# Patient Record
Sex: Female | Born: 1965 | Race: White | Hispanic: No | Marital: Married | Smoking: Never smoker
Health system: Southern US, Community
[De-identification: ages and names within clinical notes are randomized; demographics above are authoritative.]

## PROBLEM LIST (undated history)

## (undated) DIAGNOSIS — C439 Malignant melanoma of skin, unspecified: Secondary | ICD-10-CM

## (undated) DIAGNOSIS — K449 Diaphragmatic hernia without obstruction or gangrene: Secondary | ICD-10-CM

## (undated) DIAGNOSIS — C801 Malignant (primary) neoplasm, unspecified: Secondary | ICD-10-CM

## (undated) DIAGNOSIS — K219 Gastro-esophageal reflux disease without esophagitis: Secondary | ICD-10-CM

## (undated) HISTORY — PX: LYMPH GLAND EXCISION: SHX13

## (undated) HISTORY — DX: Gastro-esophageal reflux disease without esophagitis: K21.9

## (undated) HISTORY — DX: Malignant melanoma of skin, unspecified: C43.9

## (undated) HISTORY — PX: UPPER GI ENDOSCOPY: SHX6162

## (undated) HISTORY — DX: Diaphragmatic hernia without obstruction or gangrene: K44.9

---
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
## 1986-12-12 HISTORY — PX: ANKLE SURGERY: SHX546

## 2002-12-12 DIAGNOSIS — C439 Malignant melanoma of skin, unspecified: Secondary | ICD-10-CM

## 2002-12-12 HISTORY — DX: Malignant melanoma of skin, unspecified: C43.9

## 2002-12-12 HISTORY — PX: MELANOMA EXCISION: SHX5266

## 2007-06-26 ENCOUNTER — Ambulatory Visit: Payer: Self-pay | Admitting: Unknown Physician Specialty

## 2007-11-06 ENCOUNTER — Ambulatory Visit: Payer: Self-pay | Admitting: Unknown Physician Specialty

## 2007-12-11 ENCOUNTER — Ambulatory Visit: Payer: Self-pay | Admitting: Unknown Physician Specialty

## 2008-03-12 ENCOUNTER — Ambulatory Visit: Payer: Self-pay | Admitting: Unknown Physician Specialty

## 2008-07-16 ENCOUNTER — Ambulatory Visit: Payer: Self-pay | Admitting: Unknown Physician Specialty

## 2009-12-02 ENCOUNTER — Ambulatory Visit: Payer: Self-pay | Admitting: Unknown Physician Specialty

## 2009-12-15 ENCOUNTER — Ambulatory Visit: Payer: Self-pay | Admitting: Unknown Physician Specialty

## 2010-12-03 ENCOUNTER — Ambulatory Visit: Payer: Self-pay | Admitting: Unknown Physician Specialty

## 2011-12-14 ENCOUNTER — Ambulatory Visit: Payer: Self-pay

## 2012-12-12 HISTORY — PX: CHOLECYSTECTOMY: SHX55

## 2013-06-10 ENCOUNTER — Ambulatory Visit: Payer: Self-pay

## 2013-07-26 ENCOUNTER — Ambulatory Visit: Payer: Self-pay | Admitting: Family Medicine

## 2013-07-30 ENCOUNTER — Other Ambulatory Visit: Payer: Self-pay | Admitting: General Surgery

## 2013-07-30 ENCOUNTER — Encounter: Payer: Self-pay | Admitting: General Surgery

## 2013-07-30 ENCOUNTER — Ambulatory Visit (INDEPENDENT_AMBULATORY_CARE_PROVIDER_SITE_OTHER): Payer: BC Managed Care – PPO | Admitting: General Surgery

## 2013-07-30 VITALS — BP 152/82 | HR 88 | Resp 16 | Ht 71.0 in | Wt 164.0 lb

## 2013-07-30 DIAGNOSIS — K8020 Calculus of gallbladder without cholecystitis without obstruction: Secondary | ICD-10-CM

## 2013-07-30 DIAGNOSIS — K8000 Calculus of gallbladder with acute cholecystitis without obstruction: Secondary | ICD-10-CM | POA: Insufficient documentation

## 2013-07-30 NOTE — Patient Instructions (Addendum)
.Cholecystostomy  The gallbladder is a pear-shaped organ that lies beneath the liver on the right side of the body. The gallbladder stores bile, a fluid that helps the body digest fats. However, sometimes bile and other fluids build up in the gallbladder because of an obstruction (for example, a gallstones). This can cause fever, pain, swelling, nausea and other serious symptoms. The procedure used to drain these fluids is called a cholecystostomy. A tube is inserted into the gallbladder. Fluid drains through the tube into a plastic bag outside the body. This procedure is usually done on people who are admitted to the hospital. The procedure is often recommended for people who cannot have gallbladder surgery right away, usually because they are too ill to make it through surgery. The cholecystostomy tube is usually temporary, until surgery can be done. RISKS AND COMPLICATIONS Although rare, complications can include: Clogging of the tube. Infection in or around the drain site. Antibiotics might be prescribed for the infection. Or, another tube might be inserted to drain the infected fluid. Internal bleeding from the liver. BEFORE THE PROCEDURE  Try to quit smoking several weeks before the procedure. Smoking can slow healing. Arrange for someone to drive you home from the hospital. Right before your procedure, avoid all foods and liquids after midnight. This includes coffee, tea and water. On the day of the procedure, arrive early to fill out all the paperwork. PROCEDURE You will be given a sedative to make you sleepy and a local anesthetic to numb the skin. Next, a small cut is made in the abdomen. Then a tube is threaded through the cut into the gallbladder. The procedure is usually done with ultrasound to guide the tube into the gallbladder. Once the tube is in place, the drain is secured to the skin with a stitch. The tube is then connected to a drainage bag.  AFTER THE PROCEDURE  People who have  a cholecystostomy usually stay in the hospital for several days because they are so ill. You might not be able to eat for the first few days. Instead, you will be connected to an IV for fluids and nutrients. The procedure does not cure the blockage that caused the fluid to build up in the first place. Because of this, the gallbladder will need to be removed in the future. The drain is removed at that time. HOME CARE INSTRUCTIONS  Be sure to follow your healthcare provider's instructions carefully. You may shower but avoid tub baths and swimming until your caregiver says it is OK. Eat and drink according to the directions you have been given. And be sure to make all follow-up appointments.  Call your healthcare provider if you notice new pain, redness or swelling around the wound. SEEK IMMEDIATE MEDICAL CARE IF:  There is increased abdominal pain. Nausea or vomiting occurs. You develop a fever. The drainage tube comes out of the abdomen. Document Released: 02/24/2009 Document Revised: 02/20/2012 Document Reviewed: 02/24/2009 [facility] Patient Information 2014 [place], [place].    Sweating.  Chills.  Fever.  Yellowing of the skin and the whites of the eyes (jaundice). DIAGNOSIS  Your caregiver may order blood tests to look for infection or gallbladder problems. Your caregiver may also order imaging tests, such as an ultrasound or computed tomography (CT) scan. Further tests may include a hepatobiliary iminodiacetic acid (HIDA) scan. This scan allows your caregiver to see your bile move from the liver to the gallbladder and to the small intestine. TREATMENT  A hospital stay is usually  necessary to lessen the inflammation of your gallbladder. You may be required to not eat or drink (fast) for a certain amount of time. You may be given medicine to treat pain or an antibiotic medicine to treat an infection. Surgery may be needed to remove your gallbladder (cholecystectomy) once the inflammation has  gone down. Surgery may be needed right away if you develop complications such as death of gallbladder tissue (gangrene) or a tear (perforation) of the gallbladder.  HOME CARE INSTRUCTIONS  Home care will depend on your treatment. In general:  If you were given antibiotics, take them as directed. Finish them even if you start to feel better.  Only take over-the-counter or prescription medicines for pain, discomfort, or fever as directed by your caregiver.  Follow a low-fat diet until you see your caregiver again.  Keep all follow-up visits as directed by your caregiver. SEEK IMMEDIATE MEDICAL CARE IF:   Your pain is increasing and not controlled by medicines.  Your pain moves to another part of your abdomen or to your back.  You have a fever.  You have nausea and vomiting. MAKE SURE YOU:  Understand these instructions.  Will watch your condition.  Will get help right away if you are not doing well or get worse. Document Released: 11/28/2005 Document Revised: 02/20/2012 Document Reviewed: 10/14/2011 [facility] Patient Information 2014 [place], [place].  Patient's surgery has been scheduled for 07-31-13 at [facility].

## 2013-07-30 NOTE — Progress Notes (Signed)
Patient ID: Sabrina Ritter, female   DOB: 05/28/1966, 47 y.o.   MRN: [id number]  Chief Complaint  Patient presents with  . Other    gallstones    HPI Sabrina Ritter is a 47 y.o. female here today for abdominal pain been going on for six months. Patient states the pains starts in her right upper quadrant and continues to her back. The pain typically begins late morning and will persist in the early afternoon. She has the same breakfast every day, one cup of old female, and has not noticed any distinct dietary pattern. The pain began in mid abdomen, loose the right upper quadrant and then in the back, into the intrascapular region. There's been some nausea but no vomiting. No change in bowel habits (every 2-3 days). Abdominal bloating has been significant at times. No weight loss. The pain lasted up to three to six hours daily.She had an ultrasound on 07/26/13 and had blood work done Dr.Hedricks office on 07/22/13. The patient reports that she was diagnosed with a hiatal hernia in 2008. Upper endoscopy completed by Lynnae Prude showed a small hernia. Biopsies were negative for pathologic change in both the distal esophagus and the stomach. She normally has good relief of her reflux symptoms with the use of Protonix.  She reports her mother and 2 maternal aunts have had gallbladder surgery.  The patient reports that when she is undergone general anesthesia in the past she takes quite a while to wake up. (Last procedure was a left upper extremity melanoma excision with sentinel node biopsy completed at [facility]. She was not given any explanation for the delayed awakening. HPI  Past Medical History  Diagnosis Date  . GERD (gastroesophageal reflux disease)   . Hiatal hernia     Past Surgical History  Procedure Laterality Date  . Melanoma excision Left 2004    stage 3   . Ganglion cyst excision Left 1974  . Ankle surgery Right 1988  . Lymph gland excision Left     2 lymph glands removed.  Marland Kitchen Upper  gi endoscopy      Family History  Problem Relation Age of Onset  . Cholelithiasis Mother     Social History History  Substance Use Topics  . Smoking status: Never Smoker   . Smokeless tobacco: Never Used  . Alcohol Use: No    Allergies  Allergen Reactions  . Keflex [Cephalexin]     Bone problems    Current Outpatient Prescriptions  Medication Sig Dispense Refill  . b complex vitamins tablet Take 1 tablet by mouth daily.      . Calcium-Vitamin D-Vitamin K (CALCIUM + D + K PO) Take 1 tablet by mouth daily.      . cetirizine (ZYRTEC) 10 MG tablet Take 10 mg by mouth daily.      Marland Kitchen estradiol (CLIMARA - DOSED IN MG/24 HR) 0.1 mg/24hr patch Place 1 patch onto the skin.      . Multiple Vitamins-Minerals (MULTIVITAMIN WITH MINERALS) tablet Take 1 tablet by mouth daily.      . pantoprazole (PROTONIX) 40 MG tablet Take 40 mg by mouth daily.       No current facility-administered medications for this visit.    Review of Systems Review of Systems  Constitutional: Negative.   Respiratory: Negative.   Cardiovascular: Negative.   Gastrointestinal: Positive for nausea and abdominal pain. Negative for vomiting, diarrhea, constipation, blood in stool, abdominal distention, anal bleeding and rectal pain.    Blood pressure 152/82, pulse  88, resp. rate 16, height 5\' 11"  (1.803 m), weight 164 lb (74.39 kg), last menstrual period 07/16/2013.  Physical Exam Physical Exam  Constitutional: She is oriented to person, place, and time. She appears well-developed and well-nourished.  Neck: Neck supple.  Cardiovascular: Normal rate, regular rhythm and normal heart sounds.   Pulmonary/Chest: Breath sounds normal.  Abdominal: Soft. Bowel sounds are normal. There is no hepatosplenomegaly.  Lymphadenopathy:    She has no cervical adenopathy.    She has no axillary adenopathy.  Neurological: She is alert and oriented to person, place, and time.  Skin: Skin is warm and dry.    Data Reviewed EGD  and biopsy results of 2008.  Abdominal ultrasound dated 07/26/2013 showed multiple sizable stones without evidence of acute cholecystitis. No other intra-abdominal pathology detected. Common bile duct diameter: 4.1 mm.  Laboratory studies dated 07/23/2013 showed normal CBC, normal comprehensive metabolic panel, bilirubin 0.4, creatinine 0.9 with an estimated GFR greater than 60.  Assessment    Cholelithiasis, possible chronic cholecystitis.  Left upper extremity melanoma.  Delayed awakening from anesthesia.      Plan    The likelihood of a cold metastatic disease from her melanoma of 10 years ago seems unlikely. If there were causing her abdominal pain, I would've expected abnormal liver function studies. Considering the size of the stones, I think is reasonable to proceed to cholecystectomy. If she remains asymptomatic and no intra-abdominal findings are appreciated during the laparoscopic portion of the procedure, further diagnostic testing could be undertaken.  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 With school scheduled to start on August 25 she was anxious to proceed early to cholecystectomy.    Patient's surgery has been scheduled for 07-31-13 at [facility].    [name] 07/30/2013, 10:22 AM

## 2013-07-31 ENCOUNTER — Ambulatory Visit: Payer: Self-pay | Admitting: General Surgery

## 2013-07-31 DIAGNOSIS — K8010 Calculus of gallbladder with chronic cholecystitis without obstruction: Secondary | ICD-10-CM

## 2013-08-02 ENCOUNTER — Encounter: Payer: Self-pay | Admitting: General Surgery

## 2013-08-02 LAB — PATHOLOGY REPORT

## 2013-08-07 ENCOUNTER — Encounter: Payer: Self-pay | Admitting: General Surgery

## 2013-08-07 ENCOUNTER — Ambulatory Visit (INDEPENDENT_AMBULATORY_CARE_PROVIDER_SITE_OTHER): Payer: BC Managed Care – PPO | Admitting: General Surgery

## 2013-08-07 VITALS — BP 144/80 | HR 68 | Resp 14 | Ht 71.0 in | Wt 164.0 lb

## 2013-08-07 DIAGNOSIS — K8020 Calculus of gallbladder without cholecystitis without obstruction: Secondary | ICD-10-CM

## 2013-08-07 NOTE — Patient Instructions (Addendum)
Patient may take tylenol, advil, or aleve as needed for pain or discomfort. The patient is aware to use a heating pad as needed for comfort. Patient may resume activities as tolerated, taking care to use proper technique when lifting .

## 2013-08-07 NOTE — Progress Notes (Signed)
Patient ID: Sabrina Ritter, female   DOB: 1966/04/10, 47 y.o.   MRN: [id number]  Chief Complaint  Patient presents with  . Routine Post Op    gallbladder    HPI Sabrina Ritter is a 47 y.o. female here today for her post op gallbladder surgery done on 07/31/13. Patient states she is doing well. HPI  Past Medical History  Diagnosis Date  . GERD (gastroesophageal reflux disease)   . Hiatal hernia     Past Surgical History  Procedure Laterality Date  . Melanoma excision Left 2004    stage 3   . Ganglion cyst excision Left 1974  . Ankle surgery Right 1988  . Lymph gland excision Left     2 lymph glands removed.  Marland Kitchen Upper gi endoscopy    . Cholecystectomy  2014    Family History  Problem Relation Age of Onset  . Cholelithiasis Mother     Social History History  Substance Use Topics  . Smoking status: Never Smoker   . Smokeless tobacco: Never Used  . Alcohol Use: No    Allergies  Allergen Reactions  . Keflex [Cephalexin]     Bone problems    Current Outpatient Prescriptions  Medication Sig Dispense Refill  . GILDESS FE 1/20 1-20 MG-MCG tablet Take 1 tablet by mouth daily.      Marland Kitchen b complex vitamins tablet Take 1 tablet by mouth daily.      . Calcium-Vitamin D-Vitamin K (CALCIUM + D + K PO) Take 1 tablet by mouth daily.      . cetirizine (ZYRTEC) 10 MG tablet Take 10 mg by mouth daily.      Marland Kitchen estradiol (CLIMARA - DOSED IN MG/24 HR) 0.1 mg/24hr patch Place 1 patch onto the skin.      . Multiple Vitamins-Minerals (MULTIVITAMIN WITH MINERALS) tablet Take 1 tablet by mouth daily.      . pantoprazole (PROTONIX) 40 MG tablet Take 40 mg by mouth daily.       No current facility-administered medications for this visit.    Review of Systems Review of Systems  Constitutional: Negative.   Respiratory: Negative.   Cardiovascular: Negative.     Blood pressure 144/80, pulse 68, resp. rate 14, height 5\' 11"  (1.803 m), weight 164 lb (74.39 kg), last menstrual period  07/16/2013.  Physical Exam Physical Exam  Constitutional: She is oriented to person, place, and time. She appears well-developed and well-nourished.  Cardiovascular: Normal rate, regular rhythm and normal heart sounds.   Pulmonary/Chest: Breath sounds normal.  Abdominal: Soft. Bowel sounds are normal. There is no tenderness.  Port site healing well.  Lymphadenopathy:    She has no cervical adenopathy.  Neurological: She is oriented to person, place, and time.  Skin: Skin is warm and dry.    Data Reviewed Pathology showed chronic cholecystitis and cholelithiasis.  Assessment    Doing well status post cholecystectomy.     Plan    Proper lifting technique reviewed. She may increase her activity as tolerated. She may begin her exercise program making use of one upper or lower extremity at a time for the next few weeks and then advanced to regular cardio equipment utilization.        [name] 08/08/2013, 7:16 PM

## 2013-08-08 ENCOUNTER — Encounter: Payer: Self-pay | Admitting: General Surgery

## 2013-12-17 DIAGNOSIS — D229 Melanocytic nevi, unspecified: Secondary | ICD-10-CM

## 2013-12-17 HISTORY — DX: Melanocytic nevi, unspecified: D22.9

## 2014-07-31 IMAGING — XA DG CHOLANGIOGRAM OPERATIVE
1 series · 1 of 1 positions shown · non-contrast
Comparison: none

REASON FOR EXAM: Cholelithiasis
COMMENTS:

PROCEDURE:     DXR - DXR CHOLANGIOGRAM OP (INITIAL)  - July 31, 2013  [DATE]
RESULT:

[Series 6001: by · 1 of 1 slices shown]
[im 1/1]
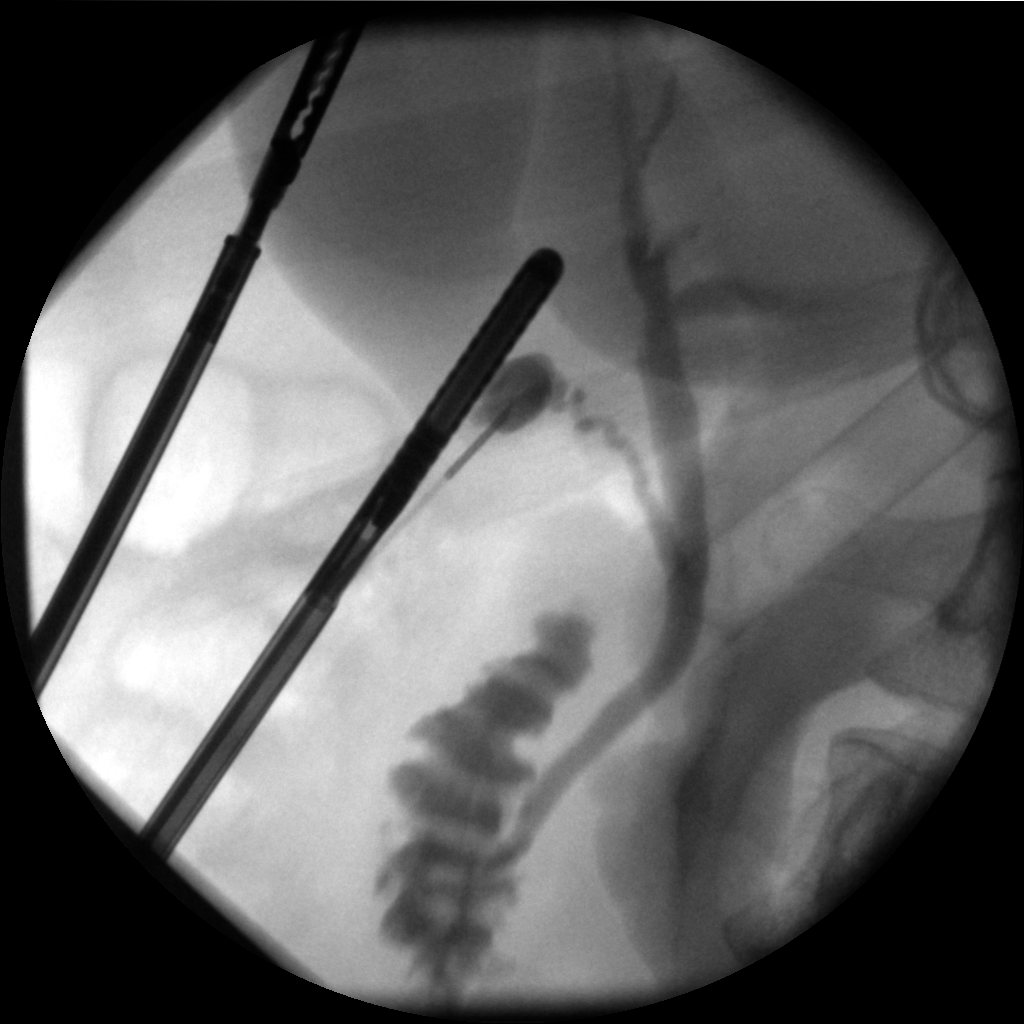

[1 of 1 positions shown; findings below may reference images not displayed]

FINDINGS: Contrast is appreciated within the cystic duct remnant, common
bile duct, intrahepatic biliary ductal radicals and duodenum. There is no
evidence of contrast extravasation, filling defects, strictural or segmental
narrowing.
IMPRESSION: 1. Intraoperative cholangiogram as described above. The remainder of the
dictation will be left to the performing physician.

## 2014-10-13 ENCOUNTER — Encounter: Payer: Self-pay | Admitting: General Surgery

## 2015-04-03 NOTE — Op Note (Signed)
PATIENT NAME:  Sabrina Ritter, Sabrina Ritter MR#:  [id number] DATE OF BIRTH:  [birth year]  DATE OF PROCEDURE:  07/31/2013  PREOPERATIVE DIAGNOSIS: Chronic cholecystitis and cholelithiasis.   POSTOPERATIVE DIAGNOSIS: Chronic cholecystitis and cholelithiasis.  OPERATIVE PROCEDURE: Laparoscopic cholecystectomy with intraoperative cholangiograms.   SURGEON: Hervey Ard, MD.  ANESTHESIA: General endotracheal under Dr. Benjamine Mola.   ESTIMATED BLOOD LOSS: Minimal.   CLINICAL NOTE: This 49 year old woman has had a 51-month history of abdominal pain. Ultrasound showed evidence of multiple gallstones. She was felt to be a candidate for elective cholecystectomy.   OPERATIVE NOTE: With the patient under adequate general endotracheal anesthesia, the abdomen was prepped with ChloraPrep and draped. After passage of a Veress needle through a transumbilical incision (with the patient in Trendelenburg position), the hanging drop test confirmed intra-abdominal location. There was a suggestion of a small 5 mm fascial defect of the umbilicus which was transgressed with the needle. The abdomen was insufflated with CO2 at 10 mmHg pressure. A 10 mm step port was expanded and inspection showed no evidence of injury from initial port placement. The patient was placed in reverse Trendelenburg position and rolled to the left.   An 11 mm Xcel port was placed in the epigastrium and two 5-mm step ports placed in the right lateral abdominal wall. The gallbladder was a dusky brown-green in color without evidence of significant edema. This was placed onto left cephalad traction. There was noted to be marked scarring near the neck of the gallbladder and a markedly enlarged cystic duct lymph node. Fluoroscopic cholangiograms were completed using 6 mL of one half strength Conray 60. This showed prompt filling of the right and left hepatic ducts, common bile duct, and free flow into the duodenum. No evidence of retained stones. The cystic duct was  clipped as were multiple branches of the cystic artery.   The gallbladder was then removed from the liver bed making use of hook cautery dissection. It was delivered through the umbilical port site. After re-establishing pneumoperitoneum, inspection from the epigastric site showed no evidence of injury from initial port placement, no intra-abdominal lesions. Normal appearing small bowel and liver surface. No evidence of peritoneal inflammation or induration. (A 10-year history of a previous melanoma excision).   The right upper quadrant was irrigated with lactated Ringer solution. Good hemostasis was noted. The abdomen was then desufflated and ports removed under direct vision. A 0 PDS suture was used to approximate the fascia at the umbilicus. Skin incisions were closed with 4-0 Vicryl subcuticular sutures. Benzoin, Steri-Strips, Telfa, and Tegaderm dressings were then applied.   The patient tolerated the procedure well and was taken to the recovery room in stable condition.    ____________________________ Robert Bellow, MD jwb:np D: 07/31/2013 17:17:42 ET T: 07/31/2013 20:41:57 ET JOB#: [id number]  cc: Robert Bellow, MD, <Dictator> Irven Easterly. Kary Kos, MD  Margy Sumler Amedeo Kinsman MD ELECTRONICALLY SIGNED 08/01/2013 14:33

## 2015-07-15 ENCOUNTER — Encounter: Payer: Self-pay | Admitting: Podiatry

## 2015-07-15 ENCOUNTER — Ambulatory Visit (INDEPENDENT_AMBULATORY_CARE_PROVIDER_SITE_OTHER): Payer: BLUE CROSS/BLUE SHIELD | Admitting: Podiatry

## 2015-07-15 VITALS — BP 120/90 | HR 71 | Resp 16 | Ht 71.0 in | Wt 160.0 lb

## 2015-07-15 DIAGNOSIS — M722 Plantar fascial fibromatosis: Secondary | ICD-10-CM

## 2015-07-15 NOTE — Progress Notes (Signed)
She presents today requesting a new para orthotics. States that she has recently had a round of plantar fasciitis and she thinks that her orthotics are getting old.  Objective: I'll signs are stable she is alert and oriented 3 no change in her past medical history medications allergy surgeries or social history. She has minimal pain on palpation medial calcaneal tubercles with a rectus foot cavus deformity.  Assessment: History of plantar fasciitis and cavus foot deformity. Mild hammertoe deformities starting.  Plan: She was scanned today for a new set of orthotics.

## 2015-07-22 ENCOUNTER — Telehealth: Payer: Self-pay | Admitting: *Deleted

## 2015-07-22 NOTE — Telephone Encounter (Signed)
Left message for Sabrina Ritter that her and Sabrina Ritter's orthotics are here

## 2015-09-09 ENCOUNTER — Telehealth: Payer: Self-pay | Admitting: *Deleted

## 2015-09-09 NOTE — Telephone Encounter (Signed)
Patient called stating she dropped off her orthotics in August to have them adjusted and has not heard anything.  Please call with status leave a message on home or cell numbers

## 2015-09-15 NOTE — Telephone Encounter (Signed)
Patient picked up these orthotics yesterday in [place].

## 2015-09-15 NOTE — Telephone Encounter (Signed)
Patient called again requesting status of orthotics.

## 2016-10-06 ENCOUNTER — Other Ambulatory Visit: Payer: Self-pay | Admitting: Obstetrics & Gynecology

## 2016-10-06 DIAGNOSIS — Z1231 Encounter for screening mammogram for malignant neoplasm of breast: Secondary | ICD-10-CM

## 2016-11-22 ENCOUNTER — Ambulatory Visit
Admission: RE | Admit: 2016-11-22 | Discharge: 2016-11-22 | Disposition: A | Discharge status: Home / Self Care | Payer: BLUE CROSS/BLUE SHIELD | Source: Ambulatory Visit | Attending: Obstetrics & Gynecology | Admitting: Obstetrics & Gynecology

## 2016-11-22 DIAGNOSIS — Z1231 Encounter for screening mammogram for malignant neoplasm of breast: Secondary | ICD-10-CM | POA: Insufficient documentation

## 2016-11-30 ENCOUNTER — Other Ambulatory Visit: Payer: Self-pay | Admitting: *Deleted

## 2016-11-30 ENCOUNTER — Inpatient Hospital Stay
Admission: RE | Admit: 2016-11-30 | Discharge: 2016-11-30 | Disposition: A | Discharge status: Home / Self Care | Payer: Self-pay | Source: Ambulatory Visit | Attending: *Deleted | Admitting: *Deleted

## 2016-11-30 DIAGNOSIS — Z9289 Personal history of other medical treatment: Secondary | ICD-10-CM

## 2017-10-11 ENCOUNTER — Other Ambulatory Visit: Payer: Self-pay | Admitting: Obstetrics & Gynecology

## 2017-10-11 DIAGNOSIS — Z1231 Encounter for screening mammogram for malignant neoplasm of breast: Secondary | ICD-10-CM

## 2017-11-23 ENCOUNTER — Ambulatory Visit
Admission: RE | Admit: 2017-11-23 | Discharge: 2017-11-23 | Disposition: A | Discharge status: Home / Self Care | Payer: BLUE CROSS/BLUE SHIELD | Source: Ambulatory Visit | Attending: Obstetrics & Gynecology | Admitting: Obstetrics & Gynecology

## 2017-11-23 DIAGNOSIS — Z1231 Encounter for screening mammogram for malignant neoplasm of breast: Secondary | ICD-10-CM | POA: Insufficient documentation

## 2017-11-23 HISTORY — DX: Malignant (primary) neoplasm, unspecified: C80.1

## 2018-11-02 ENCOUNTER — Other Ambulatory Visit: Payer: Self-pay | Admitting: Obstetrics & Gynecology

## 2018-11-02 DIAGNOSIS — Z1231 Encounter for screening mammogram for malignant neoplasm of breast: Secondary | ICD-10-CM

## 2018-11-23 IMAGING — MG MM DIGITAL SCREENING BILAT W/ TOMO W/ CAD
9 of 14 series · 9 of 30 positions shown · non-contrast
Comparison: Previous exam(s).

CLINICAL DATA: Screening.

EXAM:
2D DIGITAL SCREENING BILATERAL MAMMOGRAM WITH CAD AND ADJUNCT TOMO

[R CC (1 of 2)]
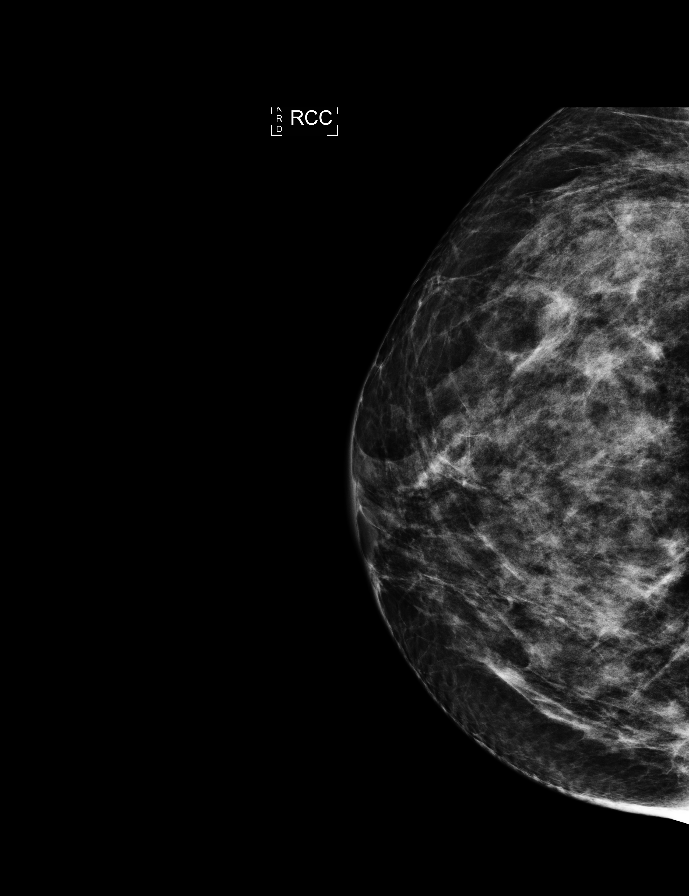

[L MLO (1 of 2)]
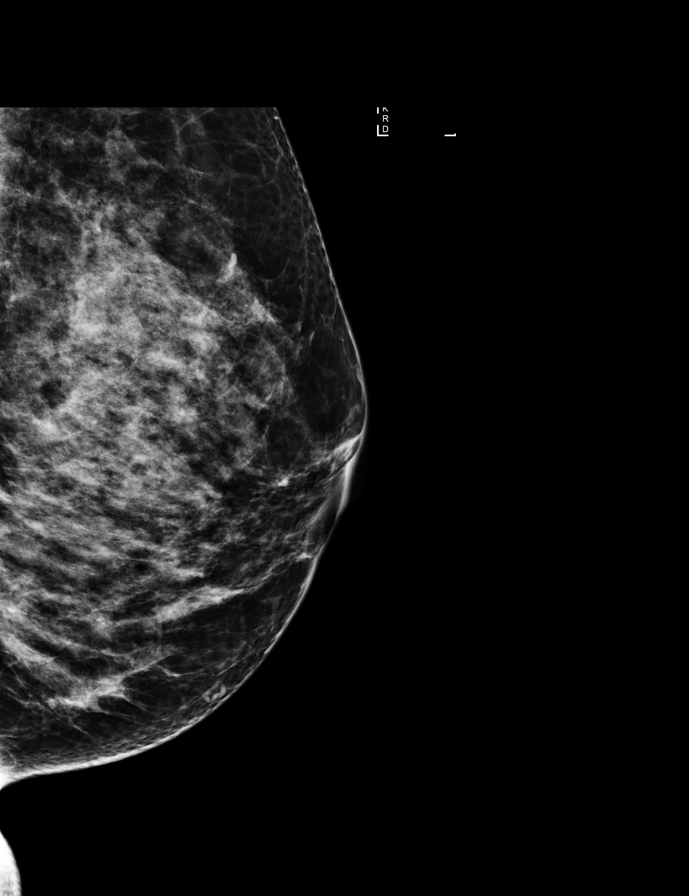

[R MLO]
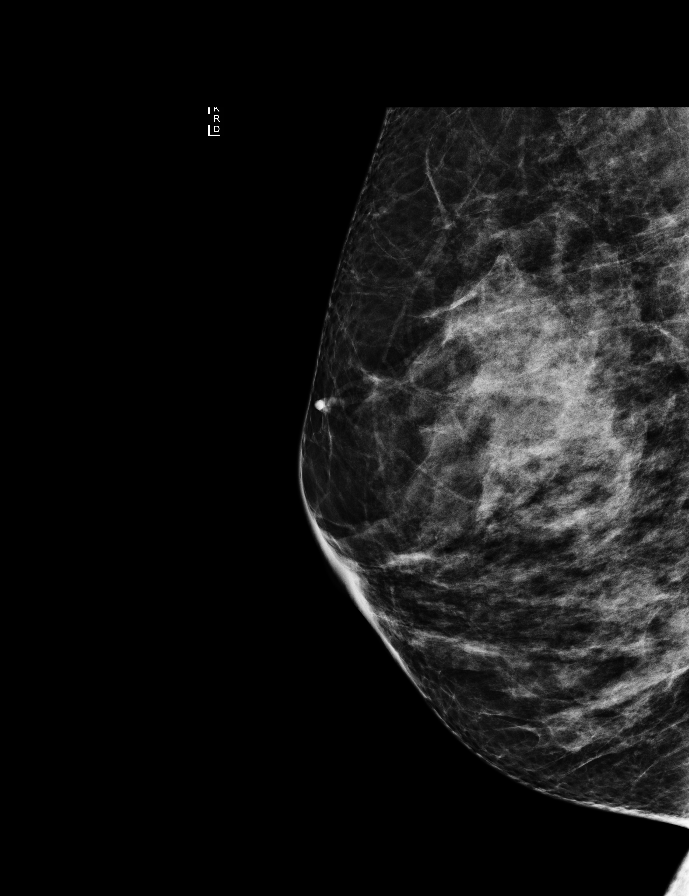

[L MLO (2 of 2)]
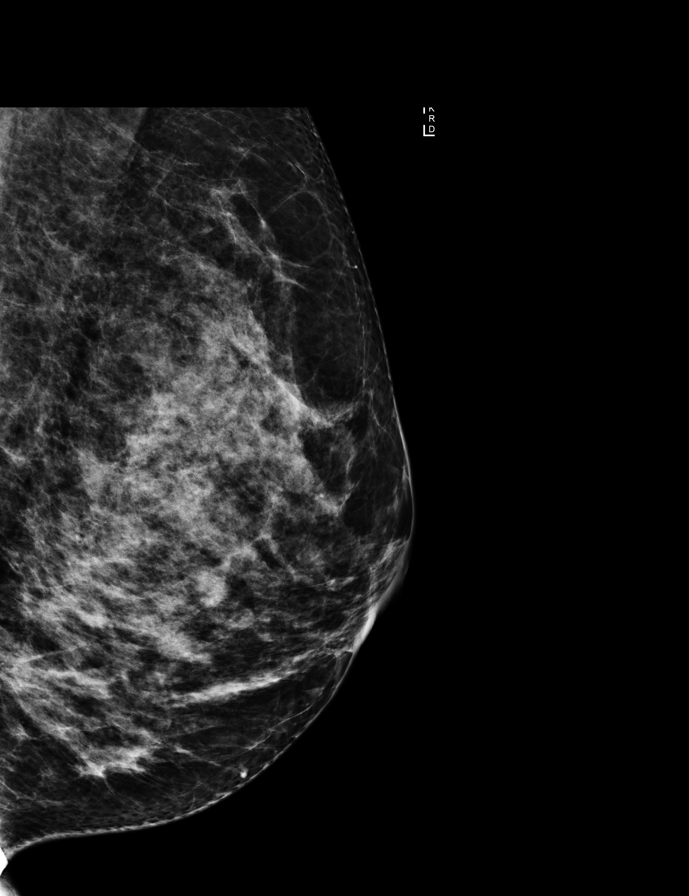

[R MLO synth-2D]
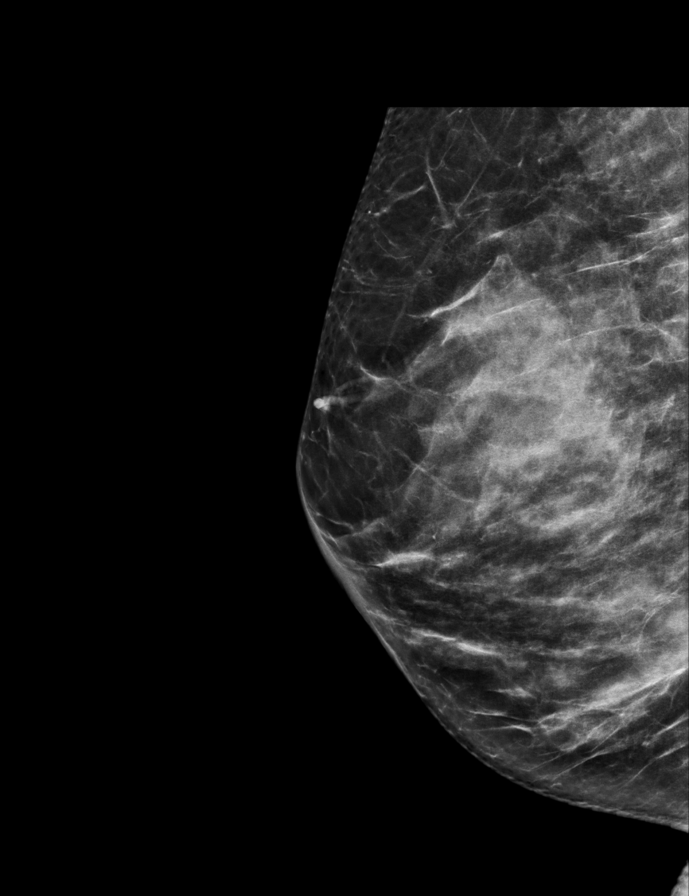

[R CC (2 of 2)]
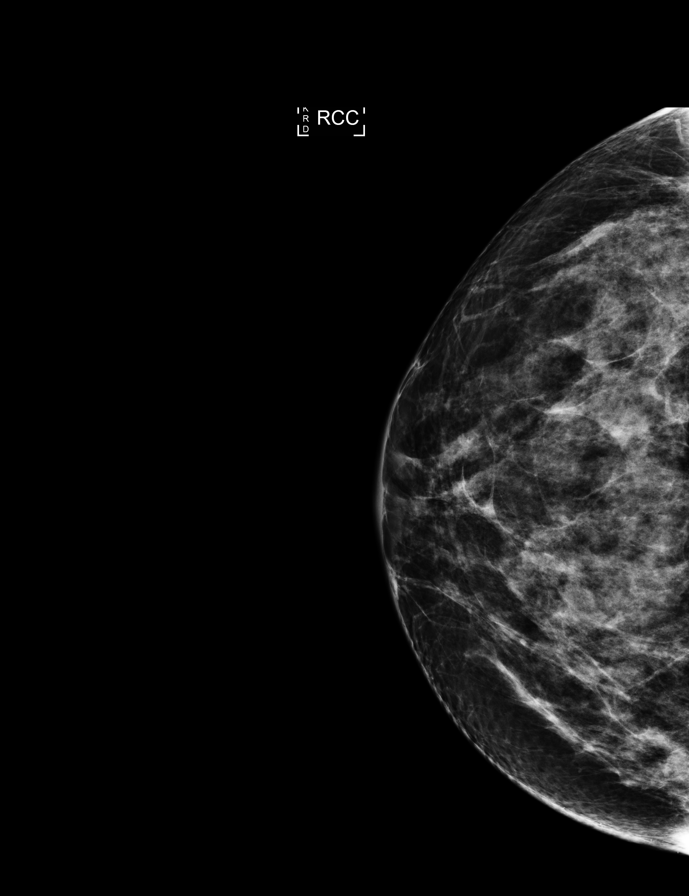

[L CC synth-2D]
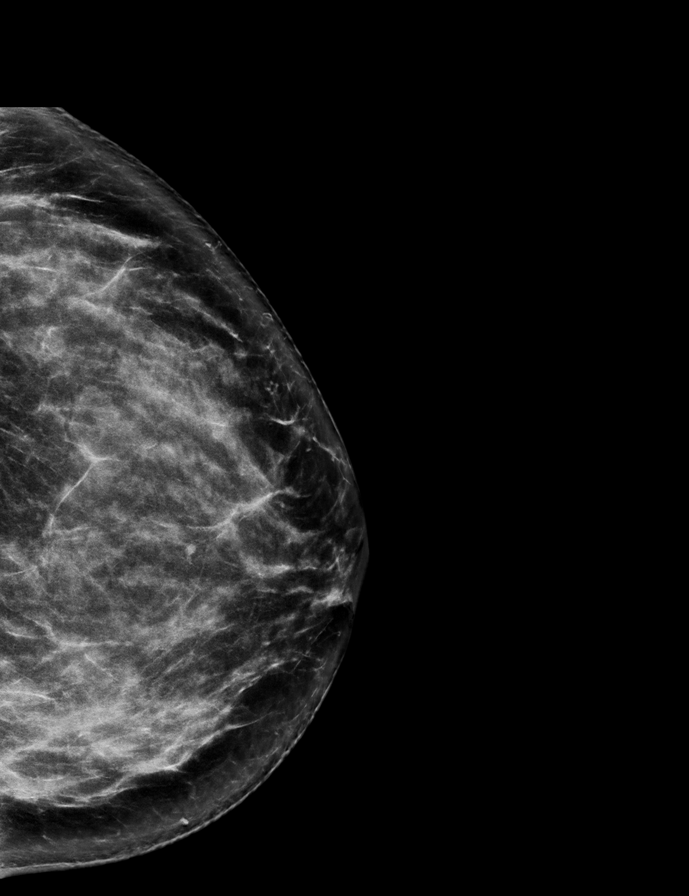

[L MLO synth-2D]
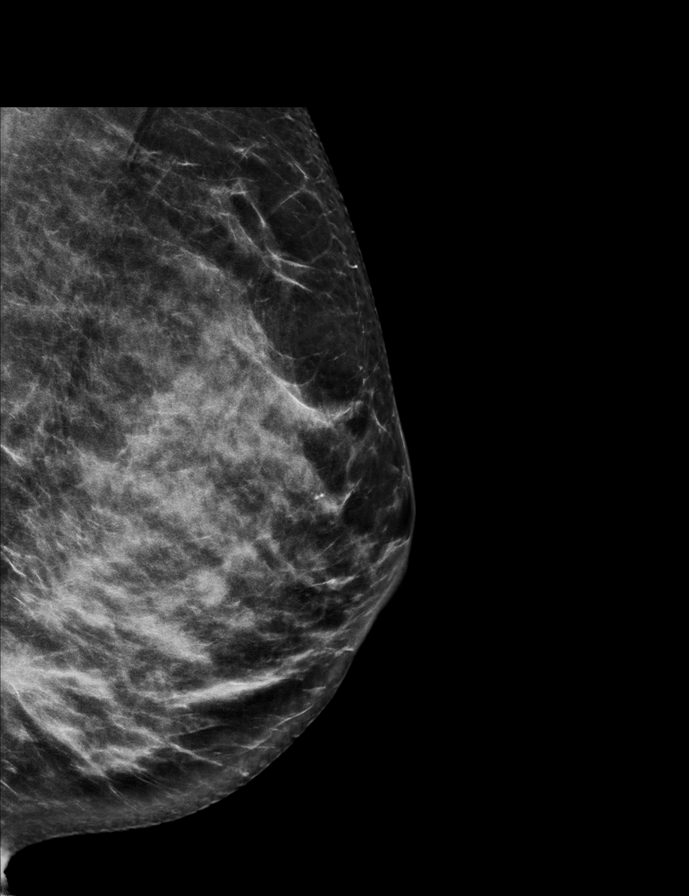

[L CC]
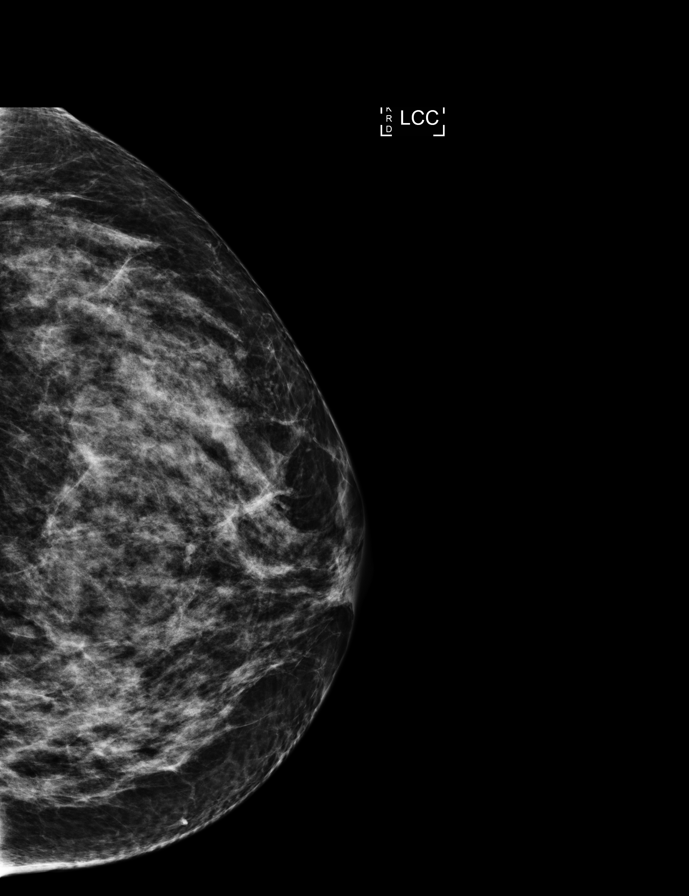

[9 of 30 positions shown; findings below may reference images not displayed]

ACR Breast Density Category c: The breast tissue is heterogeneously
dense, which may obscure small masses.
FINDINGS: There are no findings suspicious for malignancy. Images were
processed with CAD.
IMPRESSION: No mammographic evidence of malignancy. A result letter of this
screening mammogram will be mailed directly to the patient.

RECOMMENDATION:
Screening mammogram in one year. (Code:TN-[id number])

BI-RADS CATEGORY  1: Negative.

## 2018-12-13 ENCOUNTER — Ambulatory Visit
Admission: RE | Admit: 2018-12-13 | Discharge: 2018-12-13 | Disposition: A | Discharge status: Home / Self Care | Payer: BLUE CROSS/BLUE SHIELD | Source: Ambulatory Visit | Attending: Obstetrics & Gynecology | Admitting: Obstetrics & Gynecology

## 2018-12-13 DIAGNOSIS — Z1231 Encounter for screening mammogram for malignant neoplasm of breast: Secondary | ICD-10-CM

## 2019-02-04 ENCOUNTER — Ambulatory Visit: Payer: BLUE CROSS/BLUE SHIELD | Admitting: Medical

## 2019-02-04 ENCOUNTER — Encounter: Payer: Self-pay | Admitting: Medical

## 2019-02-04 VITALS — BP 157/96 | HR 82 | Temp 98.2°F | Resp 18 | Wt 176.2 lb

## 2019-02-04 DIAGNOSIS — J0100 Acute maxillary sinusitis, unspecified: Secondary | ICD-10-CM

## 2019-02-04 MED ORDER — AMOXICILLIN-POT CLAVULANATE 875-125 MG PO TABS: 1.0000 | ORAL_TABLET | Freq: Two times a day (BID) | ORAL | 0 refills | Status: DC

## 2019-02-04 NOTE — Progress Notes (Signed)
Subjective:    Patient ID: Sabrina Ritter, female    DOB: [birth year], 53 y.o.   MRN: [id number]  HPI 53 yo female in non acute distress. Sytoms stared 8 days ago with cough  Non productive.  PND. Feels fatigue.felt initially it was a cold and then  It seemed to be a sinus infection and now she just does not feel like she is getting better.  Nasal discharge out of the nose not really productive per patient. Took Dayquil this morning.  History of Bronchitis  More than 5 years, never had Pneumonia before.  Nonsmoker. Blood pressure (!) 157/96, pulse 82, temperature 98.2 F (36.8 C), temperature source Tympanic, resp. rate 18, weight 176 lb 3.2 oz (79.9 kg), last menstrual period 01/07/2019, SpO2 100 %.  Allergies  Allergen Reactions  . Keflex [Cephalexin]     Bone problems   Review of Systems  Constitutional: Positive for fatigue. Negative for chills and fever.  HENT: Positive for postnasal drip, sinus pressure, sinus pain and voice change (raspy today.). Negative for congestion, ear pain, rhinorrhea, sneezing and sore throat.   Eyes: Negative for discharge and itching.  Respiratory: Positive for cough, shortness of breath (on exertion) and wheezing (sometimes). Negative for chest tightness.   Cardiovascular: Negative for chest pain.  Gastrointestinal: Positive for diarrhea. Negative for abdominal pain, nausea and vomiting.  Genitourinary: Negative for dysuria.  Musculoskeletal: Negative for myalgias.  Skin: Negative for rash.  Allergic/Immunologic: Positive for environmental allergies. Negative for food allergies.  Neurological: Positive for headaches (forehead). Negative for dizziness, syncope and light-headedness.  Hematological: Negative for adenopathy.  Psychiatric/Behavioral: Negative for behavioral problems, confusion, self-injury and suicidal ideas. The patient is not nervous/anxious.    Patient states she can take Augmenting without difficulty. Sleeping more per patient.    Objective:   Physical Exam Vitals signs and nursing note reviewed.  Constitutional:      Appearance: Normal appearance. She is normal weight.  HENT:     Head: Normocephalic and atraumatic.     Jaw: There is normal jaw occlusion.     Right Ear: Hearing, ear canal and external ear normal. A middle ear effusion is present.     Left Ear: Hearing, ear canal and external ear normal. A middle ear effusion is present.     Nose: Congestion present.     Left Turbinates: Enlarged and swollen. Not pale.     Mouth/Throat:     Lips: Pink.     Mouth: Mucous membranes are moist.     Pharynx: Oropharynx is clear. Uvula midline. No pharyngeal swelling, oropharyngeal exudate, posterior oropharyngeal erythema or uvula swelling.     Tonsils: No tonsillar exudate or tonsillar abscesses.  Eyes:     Extraocular Movements: Extraocular movements intact.     Conjunctiva/sclera: Conjunctivae normal.     Pupils: Pupils are equal, round, and reactive to light.  Neck:     Musculoskeletal: Normal range of motion and neck supple.  Cardiovascular:     Rate and Rhythm: Normal rate and regular rhythm.     Heart sounds: Normal heart sounds.  Pulmonary:     Effort: Pulmonary effort is normal.     Breath sounds: Normal breath sounds.  Musculoskeletal: Normal range of motion.  Skin:    General: Skin is warm and dry.  Neurological:     General: No focal deficit present.     Mental Status: She is alert and oriented to person, place, and time.  Assessment & Plan:  Sinusitis Maxillary Eustachain tube dysfunction bilaterally Patient declines AVS, Rest and increase fluids. Mucinex plain take as directed if symptoms of being jittery or anxious stop the medicaiton. Restart Claritin,  OTC Floase take per package directions, can also help with allergies. Elevated BP 157/96 reviewed with patient to stop Dayquil and Nyquil. Reviewed normal BP with patient. Follow up with PCP as needed. Return to clinic in  3-5 days if not improving or if worsening. Patient verbalizes understanding and has no questions at discharge.

## 2019-12-25 ENCOUNTER — Other Ambulatory Visit: Payer: Self-pay | Admitting: Obstetrics & Gynecology

## 2020-04-02 ENCOUNTER — Ambulatory Visit: Payer: BLUE CROSS/BLUE SHIELD | Admitting: Dermatology

## 2020-05-06 ENCOUNTER — Ambulatory Visit: Payer: BC Managed Care – PPO | Admitting: Dermatology

## 2020-05-06 ENCOUNTER — Other Ambulatory Visit: Payer: Self-pay

## 2020-05-06 DIAGNOSIS — Z85820 Personal history of malignant melanoma of skin: Secondary | ICD-10-CM

## 2020-05-06 DIAGNOSIS — D485 Neoplasm of uncertain behavior of skin: Secondary | ICD-10-CM

## 2020-05-06 DIAGNOSIS — R21 Rash and other nonspecific skin eruption: Secondary | ICD-10-CM

## 2020-05-06 DIAGNOSIS — L814 Other melanin hyperpigmentation: Secondary | ICD-10-CM

## 2020-05-06 DIAGNOSIS — Z86018 Personal history of other benign neoplasm: Secondary | ICD-10-CM

## 2020-05-06 DIAGNOSIS — Z1283 Encounter for screening for malignant neoplasm of skin: Secondary | ICD-10-CM

## 2020-05-06 DIAGNOSIS — D239 Other benign neoplasm of skin, unspecified: Secondary | ICD-10-CM

## 2020-05-06 DIAGNOSIS — D229 Melanocytic nevi, unspecified: Secondary | ICD-10-CM | POA: Diagnosis not present

## 2020-05-06 DIAGNOSIS — D1801 Hemangioma of skin and subcutaneous tissue: Secondary | ICD-10-CM

## 2020-05-06 DIAGNOSIS — L578 Other skin changes due to chronic exposure to nonionizing radiation: Secondary | ICD-10-CM

## 2020-05-06 DIAGNOSIS — D492 Neoplasm of unspecified behavior of bone, soft tissue, and skin: Secondary | ICD-10-CM

## 2020-05-06 DIAGNOSIS — L821 Other seborrheic keratosis: Secondary | ICD-10-CM

## 2020-05-06 HISTORY — DX: Other benign neoplasm of skin, unspecified: D23.9

## 2020-05-06 NOTE — Progress Notes (Signed)
Follow-Up Visit   Subjective  Sabrina Ritter is a 54 y.o. female who presents for the following: Annual Exam (hx of multiple Dysplastic nevus, Melanoma of Left arm;  TBSE ). The patient presents for Total-Body Skin Exam (TBSE) for skin cancer screening and mole check.   The following portions of the chart were reviewed this encounter and updated as appropriate:  Tobacco  Allergies  Meds  Problems  Med Hx  Surg Hx  Fam Hx      Review of Systems:  No other skin or systemic complaints except as noted in HPI or Assessment and Plan.  Objective  Well appearing patient in no apparent distress; mood and affect are within normal limits.  A full examination was performed including scalp, head, eyes, ears, nose, lips, neck, chest, axillae, abdomen, back, buttocks, bilateral upper extremities, bilateral lower extremities, hands, feet, fingers, toes, fingernails, and toenails. All findings within normal limits unless otherwise noted below.  Objective  multiple see history: Scar with no evidence of recurrence.   Objective  Left posterior medial thigh: 0.4 cm irregular brown macule   Objective  Right Breast: 1.5 x 1.5 cm Pink scaly patch   Images       Assessment & Plan  History of dysplastic nevus multiple see history  Observe   Neoplasm of skin Left posterior medial thigh  Epidermal / dermal shaving  Lesion length (cm):  0.4 Lesion width (cm):  0.4 Margin per side (cm):  0.2 Total excision diameter (cm):  0.8 Informed consent: discussed and consent obtained   Timeout: patient name, date of birth, surgical site, and procedure verified   Procedure prep:  Patient was prepped and draped in usual sterile fashion Prep type:  Isopropyl alcohol Anesthesia: the lesion was anesthetized in a standard fashion   Anesthetic:  1% lidocaine w/ epinephrine 1-100,000 buffered w/ 8.4% NaHCO3 Hemostasis achieved with: pressure, aluminum chloride and electrodesiccation   Outcome:  patient tolerated procedure well   Post-procedure details: sterile dressing applied and wound care instructions given   Dressing type: bandage and petrolatum    Specimen 1 - Surgical pathology Differential Diagnosis: R/O Dysplastic nevus  Check Margins: No 0.4 cm irregular brown macule  Rash Right Breast  Cont otc Hydrocortisone cream   Watch for any changes, if changes call here to let us know   Skin cancer screening History of Melanoma Left arm  - No evidence of recurrence today - No lymphadenopathy - Recommend regular full body skin exams - Recommend daily broad spectrum sunscreen SPF 30+ to sun-exposed areas, reapply every 2 hours as needed.  - Call if any new or changing lesions are noted between office visits  Lentigines - Scattered tan macules - Discussed due to sun exposure - Benign, observe - Call for any changes  Seborrheic Keratoses - Stuck-on, waxy, tan-brown papules and plaques  - Discussed benign etiology and prognosis. - Observe - Call for any changes  Melanocytic Nevi - Tan-brown and/or pink-flesh-colored symmetric macules and papules - Benign appearing on exam today - Observation - Call clinic for new or changing moles - Recommend daily use of broad spectrum spf 30+ sunscreen to sun-exposed areas.   Hemangiomas - Red papules - Discussed benign nature - Observe - Call for any changes  Actinic Damage - diffuse scaly erythematous macules with underlying dyspigmentation - Recommend daily broad spectrum sunscreen SPF 30+ to sun-exposed areas, reapply every 2 hours as needed.  - Call for new or changing lesions.  Skin cancer screening performed  today.  Return in about 6 months (around 11/06/2020) for TBSE. IMarye Round, CMA, am acting as scribe for Sarina Ser, MD .  Documentation: I have reviewed the above documentation for accuracy and completeness, and I agree with the above.  Sarina Ser, MD

## 2020-05-06 NOTE — Patient Instructions (Signed)

## 2020-05-07 ENCOUNTER — Encounter: Payer: Self-pay | Admitting: Dermatology

## 2020-05-13 ENCOUNTER — Telehealth: Payer: Self-pay

## 2020-05-13 NOTE — Telephone Encounter (Signed)
LM on VM please return my call  

## 2020-05-13 NOTE — Telephone Encounter (Signed)
Discussed biopsy results with pt  °

## 2020-05-13 NOTE — Telephone Encounter (Signed)
-----   Message from Ralene Bathe, MD sent at 05/08/2020 10:21 AM EDT ----- Skin , left posterior medial thigh DYSPLASTIC COMPOUND NEVUS WITH MILD ATYPIA,  Dysplastic Mild Recheck next visit

## 2020-06-09 ENCOUNTER — Other Ambulatory Visit: Payer: Self-pay

## 2020-06-09 ENCOUNTER — Encounter: Payer: Self-pay | Admitting: Podiatry

## 2020-06-09 ENCOUNTER — Ambulatory Visit: Payer: BC Managed Care – PPO | Admitting: Podiatry

## 2020-06-09 DIAGNOSIS — M216X9 Other acquired deformities of unspecified foot: Secondary | ICD-10-CM

## 2020-06-09 DIAGNOSIS — M2041 Other hammer toe(s) (acquired), right foot: Secondary | ICD-10-CM

## 2020-06-09 DIAGNOSIS — M2042 Other hammer toe(s) (acquired), left foot: Secondary | ICD-10-CM

## 2020-06-09 DIAGNOSIS — Q667 Congenital pes cavus, unspecified foot: Secondary | ICD-10-CM

## 2020-06-09 NOTE — Progress Notes (Signed)
   HPI: 54 y.o. female presenting today as a reestablish patient for evaluation of bilateral foot pain is been going on chronically over the last several years.  Patient wears orthotics.  She comes in every 5 years or so for new orthotics which do help alleviate her symptoms.  She was last seen July 15, 2015.  Past Medical History:  Diagnosis Date  . Atypical mole 12/17/2013   R lat distal deltoid/mod  . Atypical mole 06/12/2014   L mid anterior thigh/mild, R mid anterior thigh/mild  . Atypical mole 02/03/2015   R lat thigh/atypical melanocytic proliferation/excision  . Atypical mole 02/09/2017   R prox ant lateral thigh/mild  . Atypical mole 08/28/2017   R medial prox post thigh near groin/mild  . Atypical mole 02/26/2018   R prox medial bicep/mod, L prox ant lateral thigh/mild  . Atypical mole 08/29/2018   R lateral inf buttock/mod  . Atypical mole 07/29/2019   R distal antero-lateral thigh near knee/mild  . Atypical mole 10/09/2019   R distal antero-lateral thigh near knee middle/mild  . Cancer ([place])    Melanoma  . GERD (gastroesophageal reflux disease)   . Hiatal hernia      Physical Exam: General: The patient is alert and oriented x3 in no acute distress.  Dermatology: Skin is warm, dry and supple bilateral lower extremities. Negative for open lesions or macerations.  Vascular: Palpable pedal pulses bilaterally. No edema or erythema noted. Capillary refill within normal limits.  Neurological: Epicritic and protective threshold grossly intact bilaterally.   Musculoskeletal Exam: Range of motion within normal limits to all pedal and ankle joints bilateral. Muscle strength 5/5 in all groups bilateral.  Increased medial longitudinal arch with cavus foot type noted.  Reducible hammertoe deformities 2-5 bilateral.  Tailor's bunion noted bilateral.   Assessment: 1.  Cavus foot type bilateral 2.  High arches bilateral 3.  Hammertoe 2-5 bilateral 4.  Tailor's bunions  bilateral   Plan of Care:  1. Patient evaluated.  2.  Appointment with Pedorthist for custom molded orthotics 3.  Return to clinic as needed     Edrick Kins, DPM [facility]  Dr. Edrick Kins, DPM    [address], [place] [address]                Office [phone]  Fax [phone]

## 2020-06-10 ENCOUNTER — Other Ambulatory Visit: Payer: Self-pay

## 2020-06-10 ENCOUNTER — Ambulatory Visit (INDEPENDENT_AMBULATORY_CARE_PROVIDER_SITE_OTHER): Payer: BC Managed Care – PPO | Admitting: Orthotics

## 2020-06-10 DIAGNOSIS — Q667 Congenital pes cavus, unspecified foot: Secondary | ICD-10-CM

## 2020-06-10 DIAGNOSIS — M216X9 Other acquired deformities of unspecified foot: Secondary | ICD-10-CM

## 2020-06-10 DIAGNOSIS — M2041 Other hammer toe(s) (acquired), right foot: Secondary | ICD-10-CM

## 2020-06-10 DIAGNOSIS — M2042 Other hammer toe(s) (acquired), left foot: Secondary | ICD-10-CM | POA: Diagnosis not present

## 2020-06-10 NOTE — Progress Notes (Signed)
Patient orderd 2nd pair of f/o from 2016 scan; also wants f/o recovered

## 2020-07-01 ENCOUNTER — Other Ambulatory Visit: Payer: BC Managed Care – PPO | Admitting: Orthotics

## 2020-07-17 ENCOUNTER — Telehealth: Payer: Self-pay | Admitting: *Deleted

## 2020-07-17 NOTE — Telephone Encounter (Signed)
I'm calling to let you know that your orthotics are here.  "Okay, that's great.  Is my husband's Sabrina Ritter) there too?"  Yes, his orthotics are her too. I tried to call him but his voicemail was full and I couldn't leave a message.  "Our son has an appointment next week, we'll pick them up then."

## 2020-10-13 ENCOUNTER — Ambulatory Visit: Payer: BC Managed Care – PPO | Admitting: Dermatology

## 2020-10-13 ENCOUNTER — Encounter: Payer: Self-pay | Admitting: Dermatology

## 2020-10-13 ENCOUNTER — Other Ambulatory Visit: Payer: Self-pay

## 2020-10-13 DIAGNOSIS — L818 Other specified disorders of pigmentation: Secondary | ICD-10-CM | POA: Diagnosis not present

## 2020-10-13 DIAGNOSIS — D229 Melanocytic nevi, unspecified: Secondary | ICD-10-CM

## 2020-10-13 DIAGNOSIS — Z85820 Personal history of malignant melanoma of skin: Secondary | ICD-10-CM | POA: Diagnosis not present

## 2020-10-13 DIAGNOSIS — D1800 Hemangioma unspecified site: Secondary | ICD-10-CM

## 2020-10-13 DIAGNOSIS — L821 Other seborrheic keratosis: Secondary | ICD-10-CM

## 2020-10-13 DIAGNOSIS — Z1283 Encounter for screening for malignant neoplasm of skin: Secondary | ICD-10-CM

## 2020-10-13 DIAGNOSIS — L814 Other melanin hyperpigmentation: Secondary | ICD-10-CM | POA: Diagnosis not present

## 2020-10-13 DIAGNOSIS — Z86018 Personal history of other benign neoplasm: Secondary | ICD-10-CM

## 2020-10-13 DIAGNOSIS — L578 Other skin changes due to chronic exposure to nonionizing radiation: Secondary | ICD-10-CM

## 2020-10-13 NOTE — Progress Notes (Signed)
   Follow-Up Visit   Subjective  Sabrina Ritter is a 54 y.o. female who presents for the following: Annual Exam (hx MM on the L arm and numerous dysplastic nevi ). The patient presents for Total-Body Skin Exam (TBSE) for skin cancer screening and mole check.  The following portions of the chart were reviewed this encounter and updated as appropriate:  Tobacco  Allergies  Meds  Problems  Med Hx  Surg Hx  Fam Hx     Review of Systems:  No other skin or systemic complaints except as noted in HPI or Assessment and Plan.  Objective  Well appearing patient in no apparent distress; mood and affect are within normal limits.  A full examination was performed including scalp, head, eyes, ears, nose, lips, neck, chest, axillae, abdomen, back, buttocks, bilateral upper extremities, bilateral lower extremities, hands, feet, fingers, toes, fingernails, and toenails. All findings within normal limits unless otherwise noted below.  Objective  arms: Hypopigmented macules on the arms  Assessment & Plan  Idiopathic guttate hypomelanosis arms  Benign-appearing.  Observation.  Call clinic for new or changing moles.  Recommend daily use of broad spectrum spf 30+ sunscreen to sun-exposed areas.     Lentigines - Scattered tan macules - Discussed due to sun exposure - Benign, observe - Call for any changes  Seborrheic Keratoses - Stuck-on, waxy, tan-brown papules and plaques  - Discussed benign etiology and prognosis. - Observe - Call for any changes  Melanocytic Nevi - Tan-brown and/or pink-flesh-colored symmetric macules and papules - Benign appearing on exam today - Observation - Call clinic for new or changing moles - Recommend daily use of broad spectrum spf 30+ sunscreen to sun-exposed areas.   Hemangiomas - Red papules - Discussed benign nature - Observe - Call for any changes  Actinic Damage - diffuse scaly erythematous macules with underlying dyspigmentation - Recommend  daily broad spectrum sunscreen SPF 30+ to sun-exposed areas, reapply every 2 hours as needed.  - Call for new or changing lesions.  History of Melanoma - L arm  - No evidence of recurrence today - No lymphadenopathy - Recommend regular full body skin exams - Recommend daily broad spectrum sunscreen SPF 30+ to sun-exposed areas, reapply every 2 hours as needed.  - Call if any new or changing lesions are noted between office visits  History of Dysplastic Nevi - No evidence of recurrence today - Recommend regular full body skin exams - Recommend daily broad spectrum sunscreen SPF 30+ to sun-exposed areas, reapply every 2 hours as needed.  - Call if any new or changing lesions are noted between office visits  Skin cancer screening performed today.  Return in about 6 months (around 04/12/2021) for TBSE - hx MM, dysplastic nevi.  Luther Redo, CMA, am acting as scribe for Sarina Ser, MD .  Documentation: I have reviewed the above documentation for accuracy and completeness, and I agree with the above.  Sarina Ser, MD

## 2020-10-14 ENCOUNTER — Encounter: Payer: Self-pay | Admitting: Dermatology

## 2021-03-25 ENCOUNTER — Other Ambulatory Visit: Payer: Self-pay

## 2021-03-25 ENCOUNTER — Telehealth: Payer: BC Managed Care – PPO | Admitting: Nurse Practitioner

## 2021-03-25 MED ORDER — AMOXICILLIN-POT CLAVULANATE 875-125 MG PO TABS: 1.0000 | ORAL_TABLET | Freq: Two times a day (BID) | ORAL | 0 refills | Status: AC

## 2021-03-25 MED ORDER — ALBUTEROL SULFATE HFA 108 (90 BASE) MCG/ACT IN AERS
2.0000 | INHALATION_SPRAY | Freq: Four times a day (QID) | RESPIRATORY_TRACT | 0 refills | Status: DC | PRN
Start: 2021-03-25 — End: 2021-04-16

## 2021-03-25 NOTE — Progress Notes (Signed)
   Subjective:    Patient ID: Sabrina Ritter, female    DOB: 23-Jun-1966, 55 y.o.   MRN: [id number]  HPI  55 year old female presenting to [facility] today with complaints of one week of nasal congestion  Using nasal rinse was improving until yesterday, she awoke today with worsening sinus pressure and fever  She took a COVID-19 home test today and she was negative.   Husband has similar symptoms week prior also COVID negative.   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   Chart history allergy to Kelfex 2014 (bone problems) tolerated Augmentin 01/2019.       Patient consents to telehealth appointment  Review of Systems  Constitutional: Positive for fatigue and fever.  HENT: Positive for congestion, ear pain, rhinorrhea and sinus pressure.   Respiratory: Positive for cough and shortness of breath.   Cardiovascular: Negative.   Genitourinary: Negative.   Neurological: Negative.        Objective:   Physical Exam  This was a telehealth appointment with patient no physical exam performed, no acute distress during phone conversation with provider.   Home COVID-19 Rapid test negative       Assessment & Plan:  Advised to continue claritin throughout allergy season, may add flonase while experiencing acute symptoms and OTC decongestant as needed.   Push fluids, rest-  Start abx for sinusitis, albuterol as needed for SOB Advised inhaler in am prior to going outside and one hour before bed + as needed throughout the day as ordered.   If symptoms persist or worsen recommend in person visit at [facility] or PCP for assessment.   Patient understands plan, is a good historian and understand medication instructions.   Meds ordered this encounter   Medications  . amoxicillin-clavulanate (AUGMENTIN) 875-125 MG tablet    Sig: Take 1 tablet by mouth 2 (two) times daily for 7 days. Take with food    Dispense:  14 tablet    Refill:  0  . albuterol (VENTOLIN HFA) 108 (90 Base) MCG/ACT inhaler    Sig: Inhale 2 puffs into the lungs every 6 (six) hours as needed for wheezing or shortness of breath (rinse mouth after use).    Dispense:  8 g    Refill:  0

## 2021-04-06 ENCOUNTER — Other Ambulatory Visit: Payer: Self-pay | Admitting: Obstetrics & Gynecology

## 2021-04-06 DIAGNOSIS — Z1231 Encounter for screening mammogram for malignant neoplasm of breast: Secondary | ICD-10-CM

## 2021-04-13 ENCOUNTER — Other Ambulatory Visit: Payer: Self-pay

## 2021-04-13 ENCOUNTER — Ambulatory Visit
Admission: RE | Admit: 2021-04-13 | Discharge: 2021-04-13 | Disposition: A | Discharge status: Home / Self Care | Payer: BC Managed Care – PPO | Source: Ambulatory Visit | Attending: Obstetrics & Gynecology | Admitting: Obstetrics & Gynecology

## 2021-04-13 DIAGNOSIS — Z1231 Encounter for screening mammogram for malignant neoplasm of breast: Secondary | ICD-10-CM | POA: Diagnosis present

## 2021-04-16 ENCOUNTER — Other Ambulatory Visit: Payer: Self-pay | Admitting: Nurse Practitioner

## 2021-04-19 ENCOUNTER — Encounter: Payer: Self-pay | Admitting: Dermatology

## 2021-04-19 ENCOUNTER — Ambulatory Visit: Payer: BC Managed Care – PPO | Admitting: Dermatology

## 2021-04-19 ENCOUNTER — Other Ambulatory Visit: Payer: Self-pay

## 2021-04-19 DIAGNOSIS — L814 Other melanin hyperpigmentation: Secondary | ICD-10-CM

## 2021-04-19 DIAGNOSIS — Z1283 Encounter for screening for malignant neoplasm of skin: Secondary | ICD-10-CM

## 2021-04-19 DIAGNOSIS — D2271 Melanocytic nevi of right lower limb, including hip: Secondary | ICD-10-CM | POA: Diagnosis not present

## 2021-04-19 DIAGNOSIS — Z85820 Personal history of malignant melanoma of skin: Secondary | ICD-10-CM

## 2021-04-19 DIAGNOSIS — L820 Inflamed seborrheic keratosis: Secondary | ICD-10-CM

## 2021-04-19 DIAGNOSIS — D2262 Melanocytic nevi of left upper limb, including shoulder: Secondary | ICD-10-CM

## 2021-04-19 DIAGNOSIS — D229 Melanocytic nevi, unspecified: Secondary | ICD-10-CM

## 2021-04-19 DIAGNOSIS — D1800 Hemangioma unspecified site: Secondary | ICD-10-CM

## 2021-04-19 DIAGNOSIS — L578 Other skin changes due to chronic exposure to nonionizing radiation: Secondary | ICD-10-CM | POA: Diagnosis not present

## 2021-04-19 DIAGNOSIS — D485 Neoplasm of uncertain behavior of skin: Secondary | ICD-10-CM

## 2021-04-19 DIAGNOSIS — L918 Other hypertrophic disorders of the skin: Secondary | ICD-10-CM

## 2021-04-19 DIAGNOSIS — Z86018 Personal history of other benign neoplasm: Secondary | ICD-10-CM

## 2021-04-19 DIAGNOSIS — L821 Other seborrheic keratosis: Secondary | ICD-10-CM

## 2021-04-19 NOTE — Patient Instructions (Signed)

## 2021-04-19 NOTE — Progress Notes (Signed)
Follow-Up Visit   Subjective  Sabrina Ritter is a 55 y.o. female who presents for the following: Annual Exam (Hx MM, dysplastic  nevi ). The patient presents for Total-Body Skin Exam (TBSE) for skin cancer screening and mole check. Patient has noticed irritated skin lesions that she would like checked today on her L knee, and L hand.   The following portions of the chart were reviewed this encounter and updated as appropriate:   Tobacco  Allergies  Meds  Problems  Med Hx  Surg Hx  Fam Hx     Review of Systems:  No other skin or systemic complaints except as noted in HPI or Assessment and Plan.  Objective  Well appearing patient in no apparent distress; mood and affect are within normal limits.  A full examination was performed including scalp, head, eyes, ears, nose, lips, neck, chest, axillae, abdomen, back, buttocks, bilateral upper extremities, bilateral lower extremities, hands, feet, fingers, toes, fingernails, and toenails. All findings within normal limits unless otherwise noted below.  Objective  L tricep: 0.3 cm light brown macule   Objective  R prox lat pretibial: 0.6 cm Irregular brown macule   Objective  L prox tricep: 0.3 cm brown macule   Objective  L dorsum hand at the middle finger MCP x 1, L lat knee x 1 (2): Erythematous keratotic or waxy stuck-on papule or plaque.   Assessment & Plan  Lentigo L tricep Benign-appearing.  Observation.  Call clinic for new or changing lesions.  Recommend daily use of broad spectrum spf 30+ sunscreen to sun-exposed areas.   Neoplasm of uncertain behavior of skin (2) R prox lat pretibial Epidermal / dermal shaving  Lesion diameter (cm):  0.6 Informed consent: discussed and consent obtained   Timeout: patient name, date of birth, surgical site, and procedure verified   Procedure prep:  Patient was prepped and draped in usual sterile fashion Prep type:  Isopropyl alcohol Anesthesia: the lesion was anesthetized in a  standard fashion   Anesthetic:  1% lidocaine w/ epinephrine 1-100,000 buffered w/ 8.4% NaHCO3 Instrument used: flexible razor blade   Hemostasis achieved with: pressure, aluminum chloride and electrodesiccation   Outcome: patient tolerated procedure well   Post-procedure details: sterile dressing applied and wound care instructions given   Dressing type: bandage and petrolatum    Specimen 1 - Surgical pathology Differential Diagnosis: D48.5 r/o dysplastic nevus  Check Margins: No 0.6 cm Irregular brown macule Hx MM   L prox tricep Epidermal / dermal shaving  Lesion diameter (cm):  0.3 Informed consent: discussed and consent obtained   Timeout: patient name, date of birth, surgical site, and procedure verified   Procedure prep:  Patient was prepped and draped in usual sterile fashion Prep type:  Isopropyl alcohol Anesthesia: the lesion was anesthetized in a standard fashion   Anesthetic:  1% lidocaine w/ epinephrine 1-100,000 buffered w/ 8.4% NaHCO3 Instrument used: flexible razor blade   Hemostasis achieved with: pressure, aluminum chloride and electrodesiccation   Outcome: patient tolerated procedure well   Post-procedure details: sterile dressing applied and wound care instructions given   Dressing type: bandage and petrolatum    Specimen 2 - Surgical pathology Differential Diagnosis: D48.5 r/o lentigo vs dysplastic nevus  Hx MM Check Margins: No 0.3 cm brown macule  Inflamed seborrheic keratosis (2) L dorsum hand at the middle finger MCP x 1, L lat knee x 1  Destruction of lesion - L dorsum hand at the middle finger MCP x 1, L lat  knee x 1 Complexity: simple   Destruction method: cryotherapy   Informed consent: discussed and consent obtained   Timeout:  patient name, date of birth, surgical site, and procedure verified Lesion destroyed using liquid nitrogen: Yes   Region frozen until ice ball extended beyond lesion: Yes   Outcome: patient tolerated procedure well with  no complications   Post-procedure details: wound care instructions given     Lentigines - Scattered tan macules - Due to sun exposure - Benign-appering, observe - Recommend daily broad spectrum sunscreen SPF 30+ to sun-exposed areas, reapply every 2 hours as needed. - Call for any changes  Seborrheic Keratoses - Stuck-on, waxy, tan-brown papules and/or plaques  - Benign-appearing - Discussed benign etiology and prognosis. - Observe - Call for any changes  Melanocytic Nevi - Tan-brown and/or pink-flesh-colored symmetric macules and papules - Benign appearing on exam today - Observation - Call clinic for new or changing moles - Recommend daily use of broad spectrum spf 30+ sunscreen to sun-exposed areas.   Hemangiomas - Red papules - Discussed benign nature - Observe - Call for any changes  Actinic Damage - Chronic condition, secondary to cumulative UV/sun exposure - diffuse scaly erythematous macules with underlying dyspigmentation - Recommend daily broad spectrum sunscreen SPF 30+ to sun-exposed areas, reapply every 2 hours as needed.  - Staying in the shade or wearing long sleeves, sun glasses (UVA+UVB protection) and wide brim hats (4-inch brim around the entire circumference of the hat) are also recommended for sun protection.  - Call for new or changing lesions.  History of Melanoma - L arm  - No evidence of recurrence today - No lymphadenopathy - Recommend regular full body skin exams - Recommend daily broad spectrum sunscreen SPF 30+ to sun-exposed areas, reapply every 2 hours as needed.  - Call if any new or changing lesions are noted between office visits  History of Dysplastic Nevi - numerous, see history  - No evidence of recurrence today - Recommend regular full body skin exams - Recommend daily broad spectrum sunscreen SPF 30+ to sun-exposed areas, reapply every 2 hours as needed.  - Call if any new or changing lesions are noted between office  visits  Acrochordons (Skin Tags) - Fleshy, skin-colored pedunculated papules - Benign appearing.  - Observe. - If desired, they can be removed with an in office procedure that is not covered by insurance. - Please call the clinic if you notice any new or changing lesions.  Skin cancer screening performed today.  Return in about 6 months (around 10/20/2021) for TBSE.  Luther Redo, CMA, am acting as scribe for Sarina Ser, MD .  Documentation: I have reviewed the above documentation for accuracy and completeness, and I agree with the above.  Sarina Ser, MD

## 2021-04-21 ENCOUNTER — Encounter: Payer: Self-pay | Admitting: Dermatology

## 2021-04-26 ENCOUNTER — Telehealth: Payer: Self-pay

## 2021-04-26 NOTE — Telephone Encounter (Signed)
-----   Message from Ralene Bathe, MD sent at 04/26/2021 10:59 AM EDT ----- Diagnosis 1. Skin , right proximal lateral pretibial DYSPLASTIC COMPOUND NEVUS WITH MODERATE ATYPIA, LATERAL AND DEEP MARGINS INVOLVED 2. Skin , left proximal tricep DYSPLASTIC COMPOUND NEVUS WITH MODERATE ATYPIA, LIMITED MARGINS FREE  1&2 - both Moderate dysplastic Recheck next visit

## 2021-04-26 NOTE — Telephone Encounter (Signed)
Advised patient of results/hd  

## 2021-06-05 ENCOUNTER — Other Ambulatory Visit: Payer: Self-pay | Admitting: Nurse Practitioner

## 2021-06-24 ENCOUNTER — Other Ambulatory Visit: Payer: Self-pay

## 2021-06-24 ENCOUNTER — Ambulatory Visit: Payer: BC Managed Care – PPO | Admitting: Nurse Practitioner

## 2021-06-24 VITALS — BP 150/91 | HR 88 | Temp 97.1°F | Wt 175.8 lb

## 2021-06-24 DIAGNOSIS — H6991 Unspecified Eustachian tube disorder, right ear: Secondary | ICD-10-CM

## 2021-06-24 NOTE — Progress Notes (Signed)
   Subjective:    Patient ID: Sabrina Ritter, female    DOB: 1966-06-24, 55 y.o.   MRN: [id number]  HPI  55 year old female presenting to [facility] today with complaints of earache. She had COVID in June and has since had intermittent pressure in her right ear. This was worsened by a recent flight to [place]. Pt has tried Mucinex for relief . Denies a history of TM rupture or surgeries/tubes.  Her main concern is an upcoming flight to [place] and wanted to assure her ear was stable prior to flight.   Has been noted to have HTN lately by PCP, exploring options for stopping OCP. Monitors BP at home and typically systolic is [id number], diastolic up to 90.   Review of Systems  Constitutional: Negative.   HENT:  Positive for ear pain.   Respiratory: Negative.    Cardiovascular: Negative.   Neurological: Negative.       Objective:   Physical Exam Constitutional:      Appearance: Normal appearance.  HENT:     Head: Normocephalic.     Right Ear: Hearing, ear canal and external ear normal. A middle ear effusion is present.     Left Ear: Hearing, ear canal and external ear normal.     Ears:     Comments: Minimal clear fluid post. TM without erythema or inflammation to right ear.     Nose: Nose normal.     Mouth/Throat:     Mouth: Mucous membranes are moist.     Pharynx: Oropharynx is clear.  Musculoskeletal:     Cervical back: Normal range of motion.  Neurological:     General: No focal deficit present.     Mental Status: She is alert.          Assessment & Plan:  1. Eustachian tube disorder, right Advised Flonase daily, mild OTD decongestant (Mucinex OK) take 1-2 hours prior to flying, and OTC advil/ibuprofen for inflammation relief.   If symptoms persist or worsen may consider low dose oral prednisone.   Warm compress to ear, sleep with right ear pointing up.   RTC as needed  Encouraged ongoing discussion with PCP regarding BP management and continue  monitoring at home

## 2021-07-14 ENCOUNTER — Other Ambulatory Visit: Payer: BC Managed Care – PPO

## 2021-07-14 ENCOUNTER — Other Ambulatory Visit: Payer: Self-pay

## 2021-07-14 DIAGNOSIS — Z0000 Encounter for general adult medical examination without abnormal findings: Secondary | ICD-10-CM

## 2021-07-14 DIAGNOSIS — E559 Vitamin D deficiency, unspecified: Secondary | ICD-10-CM

## 2021-07-15 LAB — CBC WITH DIFFERENTIAL/PLATELET
Basophils Absolute: 0.1 10*3/uL (ref 0.0–0.2)
Basos: 1 %
EOS (ABSOLUTE): 0.1 10*3/uL (ref 0.0–0.4)
Eos: 2 %
Hematocrit: 42.3 % (ref 34.0–46.6)
Hemoglobin: 13.9 g/dL (ref 11.1–15.9)
Immature Grans (Abs): 0 10*3/uL (ref 0.0–0.1)
Immature Granulocytes: 0 %
Lymphocytes Absolute: 2 10*3/uL (ref 0.7–3.1)
Lymphs: 27 %
MCH: 30 pg (ref 26.6–33.0)
MCHC: 32.9 g/dL (ref 31.5–35.7)
MCV: 91 fL (ref 79–97)
Monocytes Absolute: 0.5 10*3/uL (ref 0.1–0.9)
Monocytes: 7 %
Neutrophils Absolute: 4.6 10*3/uL (ref 1.4–7.0)
Neutrophils: 63 %
Platelets: 236 10*3/uL (ref 150–450)
RBC: 4.63 x10E6/uL (ref 3.77–5.28)
RDW: 12.8 % (ref 11.7–15.4)
WBC: 7.3 10*3/uL (ref 3.4–10.8)

## 2021-07-15 LAB — COMPREHENSIVE METABOLIC PANEL
ALT: 11 IU/L (ref 0–32)
AST: 16 IU/L (ref 0–40)
Albumin/Globulin Ratio: 1.6 (ref 1.2–2.2)
Albumin: 4.1 g/dL (ref 3.8–4.9)
Alkaline Phosphatase: 66 IU/L (ref 44–121)
BUN/Creatinine Ratio: 18 (ref 9–23)
BUN: 18 mg/dL (ref 6–24)
Bilirubin Total: 0.3 mg/dL (ref 0.0–1.2)
CO2: 22 mmol/L (ref 20–29)
Calcium: 9 mg/dL (ref 8.7–10.2)
Chloride: 105 mmol/L (ref 96–106)
Creatinine, Ser: 1.01 mg/dL — ABNORMAL HIGH (ref 0.57–1.00)
Globulin, Total: 2.6 g/dL (ref 1.5–4.5)
Glucose: 104 mg/dL — ABNORMAL HIGH (ref 65–99)
Potassium: 4.6 mmol/L (ref 3.5–5.2)
Sodium: 140 mmol/L (ref 134–144)
Total Protein: 6.7 g/dL (ref 6.0–8.5)
eGFR: 66 mL/min/{1.73_m2} (ref >59)

## 2021-07-15 LAB — URINALYSIS, ROUTINE W REFLEX MICROSCOPIC
Bilirubin, UA: NEGATIVE
Glucose, UA: NEGATIVE
Ketones, UA: NEGATIVE
Leukocytes,UA: NEGATIVE
Nitrite, UA: NEGATIVE
RBC, UA: NEGATIVE
Specific Gravity, UA: >=1.03 — AB (ref 1.005–1.030)
Urobilinogen, Ur: 0.2 mg/dL (ref 0.2–1.0)
pH, UA: 5.5 (ref 5.0–7.5)

## 2021-07-15 LAB — LIPID PANEL
Chol/HDL Ratio: 3.5 ratio (ref 0.0–4.4)
Cholesterol, Total: 212 mg/dL — ABNORMAL HIGH (ref 100–199)
HDL: 61 mg/dL (ref >39)
LDL Chol Calc (NIH): 120 mg/dL — ABNORMAL HIGH (ref 0–99)
Triglycerides: 180 mg/dL — ABNORMAL HIGH (ref 0–149)
VLDL Cholesterol Cal: 31 mg/dL (ref 5–40)

## 2021-07-15 LAB — VITAMIN D 25 HYDROXY (VIT D DEFICIENCY, FRACTURES): Vit D, 25-Hydroxy: 34.2 ng/mL (ref 30.0–100.0)

## 2021-12-14 ENCOUNTER — Other Ambulatory Visit: Payer: Self-pay | Admitting: Family Medicine

## 2021-12-14 DIAGNOSIS — R1310 Dysphagia, unspecified: Secondary | ICD-10-CM

## 2021-12-16 ENCOUNTER — Other Ambulatory Visit: Payer: BC Managed Care – PPO

## 2021-12-16 ENCOUNTER — Other Ambulatory Visit: Payer: Self-pay

## 2021-12-16 DIAGNOSIS — R1013 Epigastric pain: Secondary | ICD-10-CM

## 2021-12-18 LAB — H PYLORI, IGM, IGG, IGA AB
H pylori, IgM Abs: <9 units (ref 0.0–8.9)
H. pylori, IgA Abs: <9 units (ref 0.0–8.9)
H. pylori, IgG AbS: 0.22 Index Value (ref 0.00–0.79)

## 2021-12-20 ENCOUNTER — Ambulatory Visit: Payer: BC Managed Care – PPO | Admitting: Dermatology

## 2021-12-20 ENCOUNTER — Other Ambulatory Visit: Payer: Self-pay

## 2021-12-20 ENCOUNTER — Encounter: Payer: Self-pay | Admitting: Dermatology

## 2021-12-20 DIAGNOSIS — D1801 Hemangioma of skin and subcutaneous tissue: Secondary | ICD-10-CM

## 2021-12-20 DIAGNOSIS — L578 Other skin changes due to chronic exposure to nonionizing radiation: Secondary | ICD-10-CM

## 2021-12-20 DIAGNOSIS — L814 Other melanin hyperpigmentation: Secondary | ICD-10-CM

## 2021-12-20 DIAGNOSIS — Z86018 Personal history of other benign neoplasm: Secondary | ICD-10-CM | POA: Diagnosis not present

## 2021-12-20 DIAGNOSIS — D229 Melanocytic nevi, unspecified: Secondary | ICD-10-CM

## 2021-12-20 DIAGNOSIS — Z85820 Personal history of malignant melanoma of skin: Secondary | ICD-10-CM

## 2021-12-20 DIAGNOSIS — L821 Other seborrheic keratosis: Secondary | ICD-10-CM

## 2021-12-20 DIAGNOSIS — K141 Geographic tongue: Secondary | ICD-10-CM | POA: Diagnosis not present

## 2021-12-20 DIAGNOSIS — Z1283 Encounter for screening for malignant neoplasm of skin: Secondary | ICD-10-CM | POA: Diagnosis not present

## 2021-12-20 NOTE — Patient Instructions (Signed)

## 2021-12-20 NOTE — Progress Notes (Signed)
° °  Follow-Up Visit   Subjective  Steele Ledonne is a 56 y.o. female who presents for the following: Annual Exam. The patient presents for Total-Body Skin Exam (TBSE) for skin cancer screening and mole check.  The patient has spots, moles and lesions to be evaluated, some may be new or changing.  The following portions of the chart were reviewed this encounter and updated as appropriate:   Tobacco   Allergies   Meds   Problems   Med Hx   Surg Hx   Fam Hx      Review of Systems:  No other skin or systemic complaints except as noted in HPI or Assessment and Plan.  Objective  Well appearing patient in no apparent distress; mood and affect are within normal limits.  A full examination was performed including scalp, head, eyes, ears, nose, lips, neck, chest, axillae, abdomen, back, buttocks, bilateral upper extremities, bilateral lower extremities, hands, feet, fingers, toes, fingernails, and toenails. All findings within normal limits unless otherwise noted below.   Assessment & Plan  Geographic tongue tongue  Benign-appearing.  Observation.  Call clinic for new or changing lesions.  Recommend daily use of broad spectrum spf 30+ sunscreen to sun-exposed areas.    History of Dysplastic Nevi - No evidence of recurrence today - Recommend regular full body skin exams - Recommend daily broad spectrum sunscreen SPF 30+ to sun-exposed areas, reapply every 2 hours as needed.  - Call if any new or changing lesions are noted between office visits  History of Melanoma - No evidence of recurrence today - No lymphadenopathy - Recommend regular full body skin exams - Recommend daily broad spectrum sunscreen SPF 30+ to sun-exposed areas, reapply every 2 hours as needed.  - Call if any new or changing lesions are noted between office visits  Lentigines - Scattered tan macules - Due to sun exposure - Benign-appearing, observe - Recommend daily broad spectrum sunscreen SPF 30+ to sun-exposed areas,  reapply every 2 hours as needed. - Call for any changes  Seborrheic Keratoses - Stuck-on, waxy, tan-brown papules and/or plaques  - Benign-appearing - Discussed benign etiology and prognosis. - Observe - Call for any changes  Melanocytic Nevi - Tan-brown and/or pink-flesh-colored symmetric macules and papules - Benign appearing on exam today - Observation - Call clinic for new or changing moles - Recommend daily use of broad spectrum spf 30+ sunscreen to sun-exposed areas.   Hemangiomas - Red papules - Discussed benign nature - Observe - Call for any changes  Actinic Damage - Chronic condition, secondary to cumulative UV/sun exposure - diffuse scaly erythematous macules with underlying dyspigmentation - Recommend daily broad spectrum sunscreen SPF 30+ to sun-exposed areas, reapply every 2 hours as needed.  - Staying in the shade or wearing long sleeves, sun glasses (UVA+UVB protection) and wide brim hats (4-inch brim around the entire circumference of the hat) are also recommended for sun protection.  - Call for new or changing lesions.  Skin cancer screening performed today.  Return in about 6 months (around 06/19/2022) for TBSE.  Luther Redo, CMA, am acting as scribe for Sarina Ser, MD . Documentation: I have reviewed the above documentation for accuracy and completeness, and I agree with the above.  Sarina Ser, MD

## 2021-12-21 ENCOUNTER — Encounter: Payer: Self-pay | Admitting: Dermatology

## 2021-12-22 ENCOUNTER — Other Ambulatory Visit: Payer: BC Managed Care – PPO

## 2021-12-30 ENCOUNTER — Ambulatory Visit
Admission: RE | Admit: 2021-12-30 | Discharge: 2021-12-30 | Disposition: A | Discharge status: Home / Self Care | Payer: BC Managed Care – PPO | Source: Ambulatory Visit | Attending: Family Medicine | Admitting: Family Medicine

## 2021-12-30 DIAGNOSIS — R1310 Dysphagia, unspecified: Secondary | ICD-10-CM | POA: Diagnosis not present

## 2022-06-20 ENCOUNTER — Ambulatory Visit: Payer: BC Managed Care – PPO | Admitting: Dermatology
